# Patient Record
Sex: Male | Born: 1945 | Race: White | Hispanic: No | Marital: Married
Health system: Northeastern US, Academic
[De-identification: ages and names within clinical notes are randomized; demographics above are authoritative.]

---

## 2015-12-27 ENCOUNTER — Ambulatory Visit

## 2016-01-11 ENCOUNTER — Ambulatory Visit (HOSPITAL_BASED_OUTPATIENT_CLINIC_OR_DEPARTMENT_OTHER): Admitting: Psychiatry

## 2016-01-11 ENCOUNTER — Ambulatory Visit: Admitting: Neurological Surgery

## 2016-01-11 ENCOUNTER — Ambulatory Visit

## 2016-01-11 NOTE — Progress Notes (Signed)
.  Progress Notes  .  Patient: James Hampton, James Hampton  Provider: Corinna Capra I   .  DOB: 1945/07/29 Age: 70 Y Sex: Male  .  PCP: Dorthula Nettles MD  Date: 01/11/2016  .  --------------------------------------------------------------------------------  .  REASON FOR APPOINTMENT  .  1. Low back pain and right leg pain  .  HISTORY OF PRESENT ILLNESS  .  Associated Providers:  Primary Care Provider  Dr. Dorthula Nettles, [place] .  Other Providers  Daryel November MD, cardiology [phone].  .   70 y.o. male presents in followup status post L3-4 laminectomies  and left L3-4 TLIF on 12/27/14. He is also status post left L3-4  microdiskectomy and left L4-5 partial hemilaminectomy 08/05/13 and  L4-5 laminectomy and fusion in 2010. He had been doing well until  three months ago. He developed low back pain He also has right  leg pain. The right leg pain radiates from the right buttock to  the posterior thigh to the posterior calf. He denies left leg  pain. The right leg pain is worse with walking. He saw vascular  surgeon and was ruled out for vascular problem. He has not  undergone recent injections. He has had lumbar injections in  2010. He has had multiple courses of physical therapy without  lasting benefit.  .  CURRENT MEDICATIONS  .  Taking Aspirin 81 MG Tablet Delayed Release 1 tablet Once a day  Taking Crestor 40 MG Tablet 1 tablet at bedtime Once a day  Taking Gabapentin 300 MG Capsule 1 capsule at bedtime once a day  Taking Vexol 1 % Suspension 1 drop into left eye once a day  Taking Zetia 10 MG Tablet 1 tablet Once a day  Taking Aleve 220 MG Tablet 1 tablet as needed every 12 hrs  Medication List reviewed and reconciled with the patient  .  PAST MEDICAL HISTORY  .  Coronary artery disease status post stent placement times three  2007  Hypercholesterolemia  Iritis due to shingles left eye 2007  Right knee pain (cortisone injections)  Bleeding hemorrhoids  Lyme disease  .  ALLERGIES  .  Penicillin: unknown  reaction  .  SURGICAL HISTORY  .  L4-L5 laminectomy and fusion 05/04/2009  coronary artery stent times three 2007  Left L3-L4 microdiscectomy with left L4-L5 partial  hemilaminectomy 08/05/2013  Right knee replacement 10/10/13  L3-4 revision laminectomies and left L3-4 TLIF (transforaminal  lumbar interbody fusion) 12/27/2014  .  FAMILY HISTORY  .  Mother: deceased, stomach cancer, diagnosed with Cancer  Father: deceased, renal failure, diagnosed with Other  1 son(s) , 1 daughter(s) - healthy.  1 brother died from lung cancer age 64. 1 sister has COPD.  Marland Kitchen  SOCIAL HISTORY  .  .  Tobacco  history: Formerly smoked.  .  Work/Occupation: retired.  .  Alcohol Occasionally.  Marland Kitchen  HOSPITALIZATION/MAJOR DIAGNOSTIC PROCEDURE  .  see surgeries  .  VITAL SIGNS  .  Pain scale 6-8, Ht-in 5'9", Wt-lbs 162, BMI 23.92.  Marland Kitchen  PHYSICAL EXAMINATION  .  NEUROSURGERY:  MOTOR Lower Extremities  : 5/5 strength in the bilateral lower  extremities. :5/5 strength in the bilateral lower extremities.  SENSATION  : Abnormal, : decreased to light touch decreased  sensation to light touch left shin. :Abnormal , :decreased to  light touch decreased sensation to light touch left shin.  Reflexes  Left Knee Jerk Absent, Right Knee Jerk 2+, Left Ankle  Jerk 1+, Right  Ankle Jerk 1+. Left Knee JerkAbsent , Right Knee  Jerk2+ , Left Ankle Jerk1+ , Right Ankle Jerk1+. Gait  : WNL.  :WNL. Babinski  : Negative. :Negative. Ankle Clonus  : Negative.  :Negative.  DIAGNOSTIC STUDIES:  MRI  Uploaded to PACS. I reviewed MRI lumbar spine performed on  12/24/15 at [facility] .  Marland Kitchen  ASSESSMENTS  .  S/P lumbar fusion - Z98.1 (Primary)  .  MRI shows foraminal stenosis L5-S1, now symptomatic.Recommend  L5-S1 ESI.If this fails to help, he will call me to schedule  L5-S1 TLIF with extension of hardware to L3. Likely would be done  in an open fashion.  .  TREATMENT  .  S/P lumbar fusion  Notes: as above.  .  Electronically signed by Corinna Capra , MD on  01/11/2016 at  09:30 AM EDT  .  Document electronically signed by Dionisio David, RON I   .

## 2016-01-11 NOTE — Progress Notes (Signed)
* * *        **Perfect, [name]**    --- ---    70 Y old Male, DOB: 09-Jul-1946, External MRN: [id number]    Account Number: [id number]    [address] [place], [id number]    Home: [phone]    Insurance: MEDICARE Payer ID: PAPER    PCP: Dorthula Nettles, MD Referring: Dorthula Nettles, MD External Visit ID:  [id number]    Appointment Facility: Neurosurgery        * * *    01/11/2016  Progress Notes: Corinna Capra, MD **CHN#:** [id number]    --- ---    ---        Current Medications    ---    Taking     * Aspirin 81 MG Tablet Delayed Release 1 tablet Once a day    ---    * Crestor 40 MG Tablet 1 tablet at bedtime Once a day    ---    * Gabapentin 300 MG Capsule 1 capsule at bedtime once a day    ---    * Vexol 1 % Suspension 1 drop into left eye once a day    ---    * Zetia 10 MG Tablet 1 tablet Once a day    ---    * Aleve 220 MG Tablet 1 tablet as needed every 12 hrs    ---    * Medication List reviewed and reconciled with the patient    ---      Past Medical History    ---       Coronary artery disease status post stent placement times three 2007.        ---    Hypercholesterolemia.        ---    Iritis due to shingles left eye 2007.        ---    Right knee pain (cortisone injections).        ---    Bleeding hemorrhoids.        ---    Lyme disease.        ---      Surgical History    ---      L4-L5 laminectomy and fusion 05/04/2009    ---    coronary artery stent times three 2007    ---    Left L3-L4 microdiscectomy with left L4-L5 partial hemilaminectomy 08/05/2013    ---    Right knee replacement 10/10/13    ---    L3-4 revision laminectomies and left L3-4 TLIF (transforaminal lumbar  interbody fusion) 12/27/2014    ---      Family History    ---      Mother: deceased, stomach cancer, diagnosed with Cancer    ---    Father: deceased, renal failure, diagnosed with Other    ---    1 son(s) , 1 daughter(s) - healthy.    ---    1 brother died from lung cancer age 20. 1 sister has COPD.    ---      Social  History    ---    Tobacco history: Formerly smoked.    Work/Occupation: retired.    Alcohol  Occasionally.      Allergies    ---      Penicillin: unknown reaction    ---    [Allergies Verified]      Hospitalization/Major Diagnostic Procedure    ---  see surgeries    ---        Reason for Appointment    ---      1\. Low back pain and right leg pain    ---      History of Present Illness    ---     _Associated Providers_ :    Primary Care Provider Dr. Dorthula Nettles, [place]. Other Providers  Daryel November MD, cardiology [phone].    70 y.o. male presents in followup status post L3-4 laminectomies and left L3-4  TLIF on 12/27/14. He is also status post left L3-4 microdiskectomy and left  L4-5 partial hemilaminectomy 08/05/13 and L4-5 laminectomy and fusion in 2010.  He had been doing well until three months ago. He developed low back pain He  also has right leg pain. The right leg pain radiates from the right buttock to  the posterior thigh to the posterior calf. He denies left leg pain. The right  leg pain is worse with walking. He saw vascular surgeon and was ruled out for  vascular problem. He has not undergone recent injections. He has had lumbar  injections in 2010. He has had multiple courses of physical therapy without  lasting benefit.      Vital Signs    ---    Pain scale 6-8, Ht-in 5'9", Wt-lbs 162, BMI 23.92.      Physical Examination    ---     _NEUROSURGERY_ :    MOTOR Lower Extremities : 5/5 strength in the bilateral lower extremities.  SENSATION : Abnormal, : decreased to light touch decreased sensation to light  touch left shin. Reflexes Left Knee Jerk Absent, Right Knee Jerk 2+, Left  Ankle Jerk 1+, Right Ankle Jerk 1+. Gait : WNL. Babinski : Negative. Ankle  Clonus : Negative.    _DIAGNOSTIC STUDIES_ :    MRI Uploaded to PACS. I reviewed MRI lumbar spine performed on 12/24/15 at  [facility] .          Assessments    ---    1\. S/P lumbar fusion - Z98.1 (Primary)    ---      MRI shows  foraminal stenosis L5-S1, now symptomatic.    Recommend L5-S1 ESI.    If this fails to help, he will call me to schedule L5-S1 TLIF with extension  of hardware to L3. Likely would be done in an open fashion.    ---      Treatment    ---       **1\. S/P lumbar fusion**    Notes: as above.    ---    Electronically signed by Corinna Capra , MD on 01/11/2016 at 09:30 AM EDT    Sign off status: Completed        * * *        Neurosurgery    [address]    [place], [place] [address]    Tel: [phone]    Fax: [phone]              * * *          Patient: James Hampton, James Hampton DOB: 31-May-1946 Progress Note: Corinna Capra,  MD 01/11/2016    ---    Note generated by eClinicalWorks EMR/PM Software ([email or web address])

## 2020-06-27 IMAGING — MR MULTI PARAMETRIC MRI PROSTATE WITHOUT AND WITH CONTRAST
13 series · 48 of 48 positions shown · IV contrast (gadavist)
Comparison: None

MULTI PARAMETRIC MRI PROSTATE WITHOUT AND WITH CONTRAST, 06/27/2020 [DATE]: 
CLINICAL INDICATION: Elevated PSA measuring 4.9 ng/mL
TECHNIQUE: Multiple parametric sequences were performed. 
Pre-contrast: T1 axial of the entire pelvis. T2 sagittal, axial and coronal, T1 
axial, acquired of the prostate. Diffusion with multiple B values of 4111, 2966 
calculated ADC value for mapping. 
Post contrast: Rapid sequence dynamic and axial planes through the prostate and 
seminal vesicles, T1 axial with fat sat of the entire pelvis. 3-D renderings 
were reconstructed on an independent workstation. The images were also evaluated 
with Dyna CAD computer aided detection.  7.5 mL of Gadavist was administered 
intravenously.  As per [HOSPITAL] guidelines 3-D 
reconstructions are performed with concurrent physician supervision. The 
patient's eGFR was calculated to be 54.1 using the i-STAT device.

[Series 101: survey-include kidney(hilum) · axial · 10.0mm · 1.34mm/px · 1 of 14 slices shown]
[im 1/14]
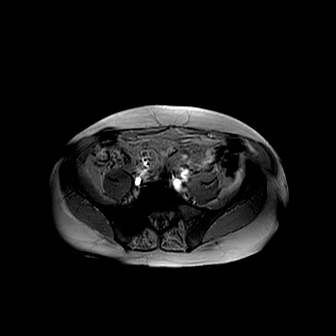

[Series 201: t1w_tse_ax · axial · 6.0mm · 0.45mm/px · 1 of 36 slices shown]
[im 1/36]
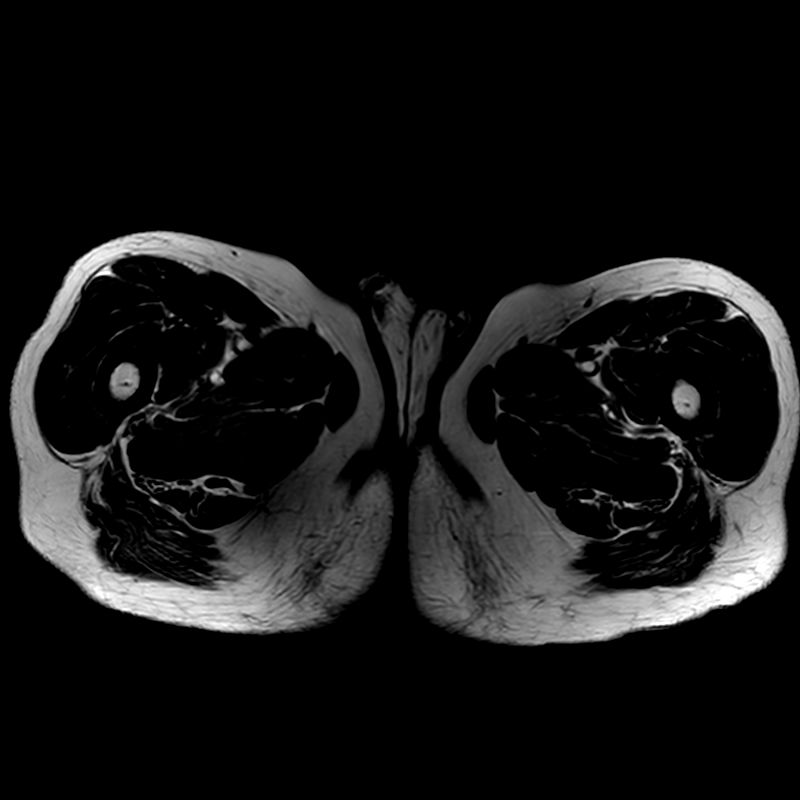

[Series 301: t2w sag · sagittal · 3.0mm · 0.45mm/px · 1 of 30 slices shown]
[im 1/30]
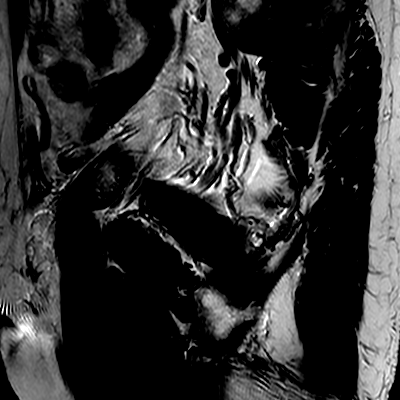

[Series 401: t2w cor · coronal · 3.0mm · 0.42mm/px · 1 of 30 slices shown]
[im 1/30]
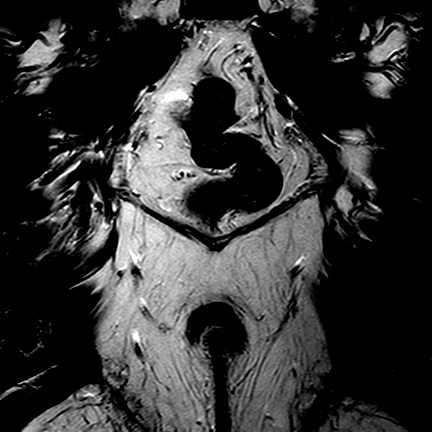

[Series 501: t2w ax · axial · 3.0mm · 0.27mm/px · 1 of 32 slices shown]
[im 1/32]
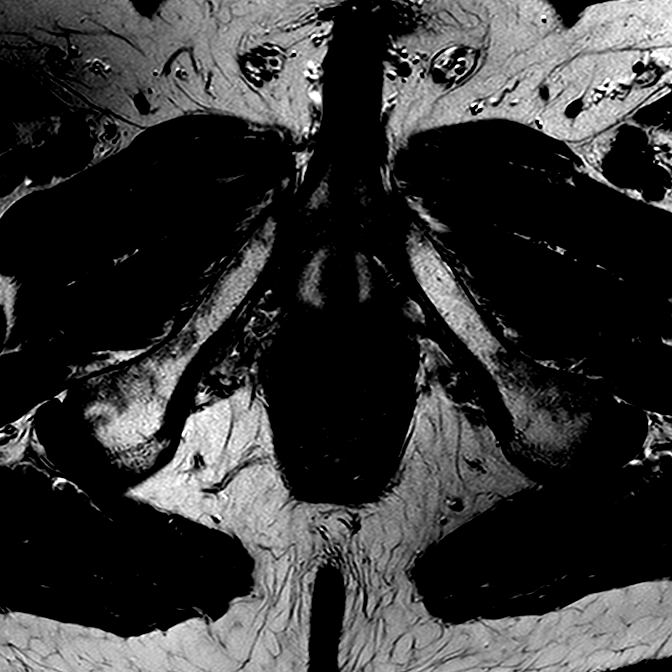

[Series 601: new-dwi_3b* 3mm* · axial · 3.0mm · 1.28mm/px · 1 of 64 slices shown]
[im 1/64]
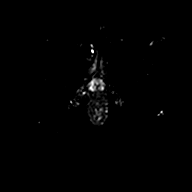

[Series 602: ADC · axial · 3.0mm · 1.28mm/px · 1 of 32 slices shown (1 of 2)]
[im 1/32]
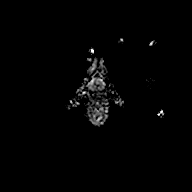

[Series 603: ADC · axial · 3.0mm · 1.28mm/px · 1 of 32 slices shown (2 of 2)]
[im 1/32]
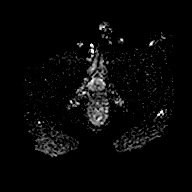

[Series 604: (id) · axial · 3.0mm · 1.28mm/px · 1 of 32 slices shown (1 of 3)]
[im 1/32]
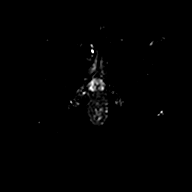

[Series 605: (id) · axial · 3.0mm · 1.28mm/px · 1 of 32 slices shown (2 of 3)]
[im 1/32]
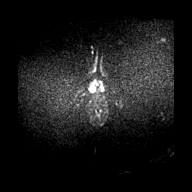

[Series 701: (id) · axial · 3.0mm · 1.28mm/px · 1 of 32 slices shown (3 of 3)]
[im 1/32]
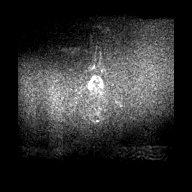

[Series 901: dyn 3mm*(ap) · axial · 3.0mm · 1.38mm/px · z∈[-175,-82]mm · 36 of 1440 slices shown]
[im 1/1440]
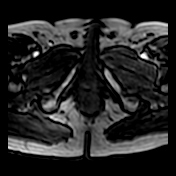
[im 42/1440]
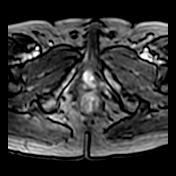
[im 83/1440]
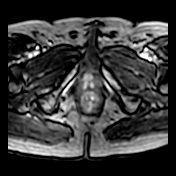
[im 124/1440]
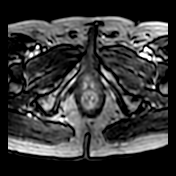
[im 165/1440]
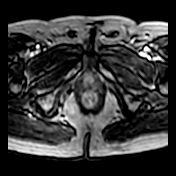
[im 206/1440]
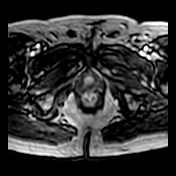
[im 247/1440]
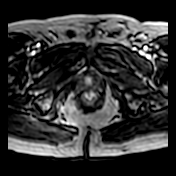
[im 288/1440]
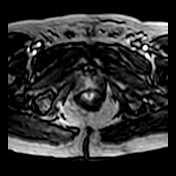
[im 329/1440]
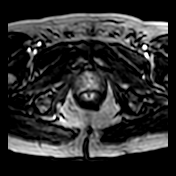
[im 371/1440]
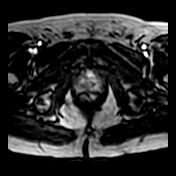
[im 412/1440]
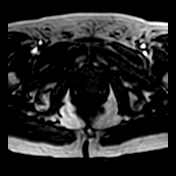
[im 453/1440]
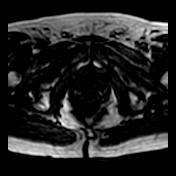
[im 494/1440]
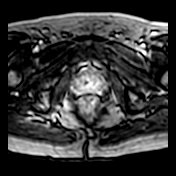
[im 535/1440]
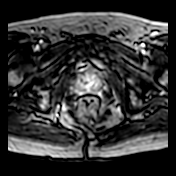
[im 576/1440]
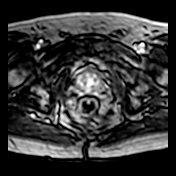
[im 617/1440]
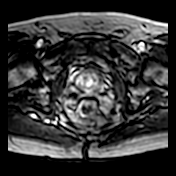
[im 658/1440]
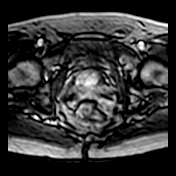
[im 699/1440]
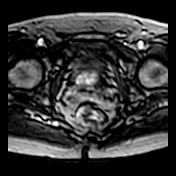
[im 741/1440]
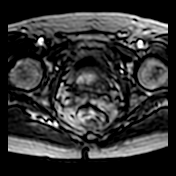
[im 782/1440]
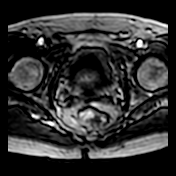
[im 823/1440]
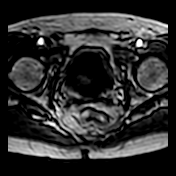
[im 864/1440]
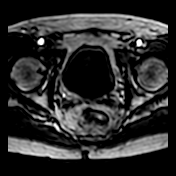
[im 905/1440]
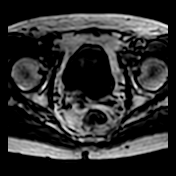
[im 946/1440]
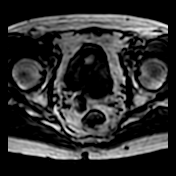
[im 987/1440]
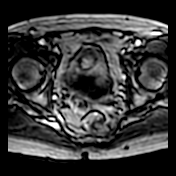
[im 1028/1440]
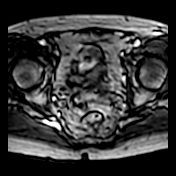
[im 1069/1440]
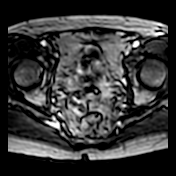
[im 1111/1440]
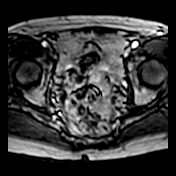
[im 1152/1440]
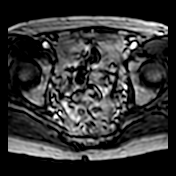
[im 1193/1440]
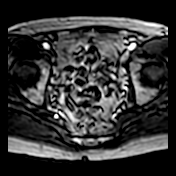
[im 1234/1440]
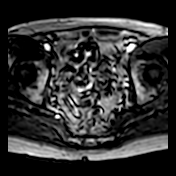
[im 1275/1440]
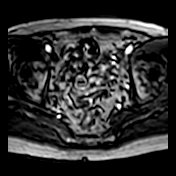
[im 1316/1440]
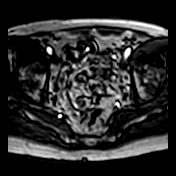
[im 1357/1440]
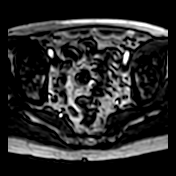
[im 1398/1440]
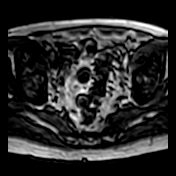
[im 1440/1440]
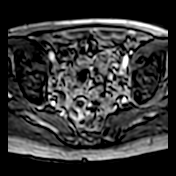

[Series 1001: t1w/sp/pel+g · axial · 6.5mm · 0.61mm/px · 1 of 40 slices shown]
[im 1/40]
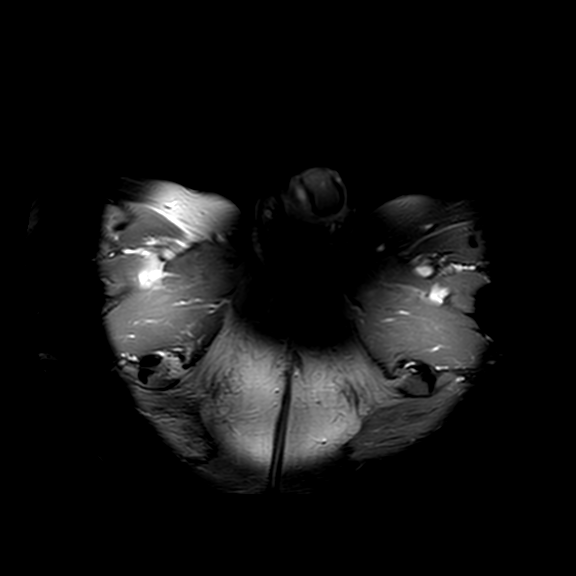

[48 of 48 positions shown; findings below may reference images not displayed]

FINDINGS: VOLUME: 28.3 mL 
CENTRAL GLAND: Nodularity in heterogeneous T2 signal of the central gland 
related to BPH. No suspicious lesions. 
PERIPHERAL ZONE: Linear areas of decreased T2 signal involving the peripheral 
zone most likely due to scarring. No areas of restricted diffusion or nodularity 
identified. 
PROSTATE CAPSULE: Intact 
MUSCLE SIDE WALLS: Normal 
SEMINAL VESICLES: Normal 
BLADDER: Mildly trabeculated. 
LYMPHADENOPATHY: None 
BONES: Surgical changes of the lumbar spine. No suspicious bony lesions. 
OTHER: Diverticular disease of the sigmoid colon.
IMPRESSION: 1. Nodularity of the central gland suggesting BPH. No suspicious lesions. 
2. Moderate scarring of the peripheral zone without mass. 3 diverticular disease 
of the sigmoid colon. 
(PI-RADS 2): Low (clinically significant cancer is unlikely to be present).

## 2020-10-09 NOTE — Progress Notes (Signed)
* * *        **James Hampton, James Hampton**    --- ---    66 Y old Male, DOB: 09-Jul-1946, External MRN: [id number]    Account Number: [id number]    [address] [place], [id number]    Home: [phone]    Insurance: MEDICARE Payer ID: PAPER    PCP: Dorthula Nettles, MD Referring: Dorthula Nettles, MD External Visit ID:  [id number]    Appointment Facility: Neurosurgery        * * *    01/11/2016  Progress Notes: Corinna Capra, MD **CHN#:** [id number]    --- ---    ---        Current Medications    ---    Taking     * Aspirin 81 MG Tablet Delayed Release 1 tablet Once a day    ---    * Crestor 40 MG Tablet 1 tablet at bedtime Once a day    ---    * Gabapentin 300 MG Capsule 1 capsule at bedtime once a day    ---    * Vexol 1 % Suspension 1 drop into left eye once a day    ---    * Zetia 10 MG Tablet 1 tablet Once a day    ---    * Aleve 220 MG Tablet 1 tablet as needed every 12 hrs    ---    * Medication List reviewed and reconciled with the patient    ---      Past Medical History    ---       Coronary artery disease status post stent placement times three 2007.        ---    Hypercholesterolemia.        ---    Iritis due to shingles left eye 2007.        ---    Right knee pain (cortisone injections).        ---    Bleeding hemorrhoids.        ---    Lyme disease.        ---      Surgical History    ---      L4-L5 laminectomy and fusion 05/04/2009    ---    coronary artery stent times three 2007    ---    Left L3-L4 microdiscectomy with left L4-L5 partial hemilaminectomy 08/05/2013    ---    Right knee replacement 10/10/13    ---    L3-4 revision laminectomies and left L3-4 TLIF (transforaminal lumbar  interbody fusion) 12/27/2014    ---      Family History    ---      Mother: deceased, stomach cancer, diagnosed with Cancer    ---    Father: deceased, renal failure, diagnosed with Other    ---    1 son(s) , 1 daughter(s) - healthy.    ---    1 brother died from lung cancer age 20. 1 sister has COPD.    ---      Social  History    ---    Tobacco history: Formerly smoked.    Work/Occupation: retired.    Alcohol  Occasionally.      Allergies    ---      Penicillin: unknown reaction    ---    [Allergies Verified]      Hospitalization/Major Diagnostic Procedure    ---  see surgeries    ---        Reason for Appointment    ---      1\. Low back pain and right leg pain    ---      History of Present Illness    ---     _Associated Providers_ :    Primary Care Provider Dr. Dorthula Nettles, [facility] [place]. Other Providers  Daryel November MD, cardiology [phone].    75 y.o. male presents in followup status post L3-4 laminectomies and left L3-4  TLIF on 12/27/14. He is also status post left L3-4 microdiskectomy and left  L4-5 partial hemilaminectomy 08/05/13 and L4-5 laminectomy and fusion in 2010.  He had been doing well until three months ago. He developed low back pain He  also has right leg pain. The right leg pain radiates from the right buttock to  the posterior thigh to the posterior calf. He denies left leg pain. The right  leg pain is worse with walking. He saw vascular surgeon and was ruled out for  vascular problem. He has not undergone recent injections. He has had lumbar  injections in 2010. He has had multiple courses of physical therapy without  lasting benefit.      Vital Signs    ---    Pain scale 6-8, Ht-in 5'9", Wt-lbs 162, BMI 23.92.      Physical Examination    ---     _NEUROSURGERY_ :    MOTOR Lower Extremities : 5/5 strength in the bilateral lower extremities.  SENSATION : Abnormal, : decreased to light touch decreased sensation to light  touch left shin. Reflexes Left Knee Jerk Absent, Right Knee Jerk 2+, Left  Ankle Jerk 1+, Right Ankle Jerk 1+. Gait : WNL. Babinski : Negative. Ankle  Clonus : Negative.    _DIAGNOSTIC STUDIES_ :    MRI Uploaded to PACS. I reviewed MRI lumbar spine performed on 12/24/15 at  [facility] .          Assessments    ---    1\. S/P lumbar fusion - Z98.1 (Primary)    ---      MRI shows  foraminal stenosis L5-S1, now symptomatic.    Recommend L5-S1 ESI.    If this fails to help, he will call me to schedule L5-S1 TLIF with extension  of hardware to L3. Likely would be done in an open fashion.    ---      Treatment    ---       **1\. S/P lumbar fusion**    Notes: as above.    ---    Electronically signed by Corinna Capra , MD on 01/11/2016 at 09:30 AM EDT    Sign off status: Completed        * * *        Neurosurgery    [address]    [place], [place] [address]    Tel: [phone]    Fax: [phone]              * * *          Patient: James Hampton, James Hampton DOB: 31-May-1946 Progress Note: Corinna Capra,  MD 01/11/2016    ---    Note generated by eClinicalWorks EMR/PM Software ([email or web address])

## 2020-10-18 IMAGING — MR MRI LUMBAR SPINE W/WO CONTRAST
4 of 9 series · 19 of 48 positions shown · IV contrast (gadavist)
Comparison: MRI exam of 12/24/2015 and dating back to 11/13/2008. CT exam of 
12/22/2014.

MRI LUMBAR SPINE W/WO CONTRAST, 10/18/2020 [DATE]: 
CLINICAL INDICATION: History of 3 prior lumbar surgeries with the last surgery 
in 0096. Ablation 7 weeks ago. Pain across lower back. No pain down legs since 
the ablation. Stenosis of lumbar region without myelopathy or radiculopathy.
TECHNIQUE: Multiplanar, multiecho position MR images of the lumbar spine were 
performed without and with 8 mL of Gadavist enhancement. The patient's eGFR was 
calculated to be 54 using the i-STAT device. Metallic suppression techniques 
were used.

[Series 301: T1 · sagittal · 4.0mm · 0.55mm/px · 4 of 19 slices shown (1 of 3)]
[im 1/19]
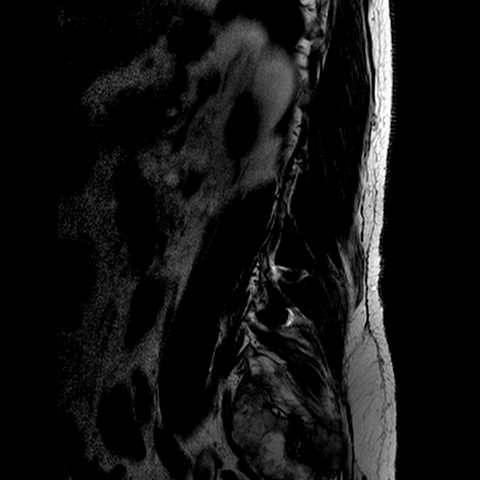
[im 7/19]
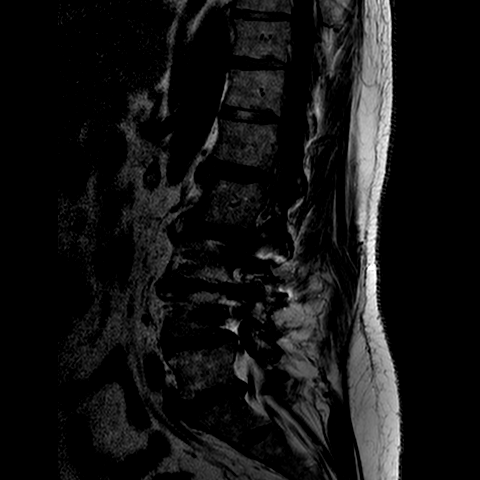
[im 13/19]
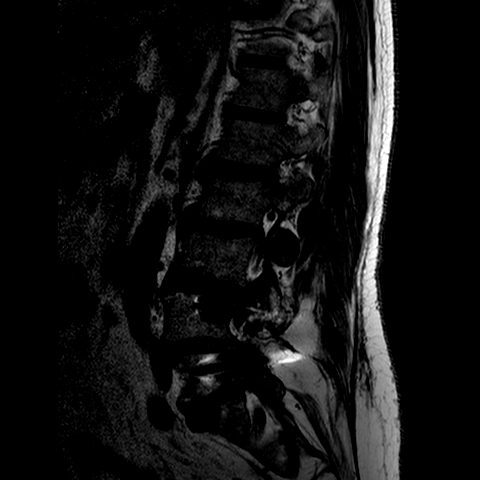
[im 19/19]
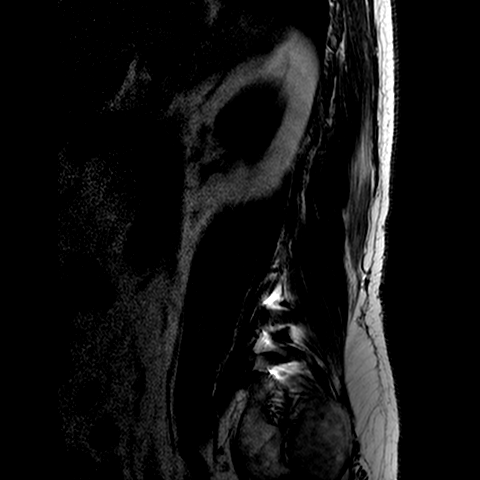

[Series 601: T2 · axial · 4.0mm · 0.47mm/px · z∈[-55,+176]mm · 6 of 30 slices shown]
[im 1/30]
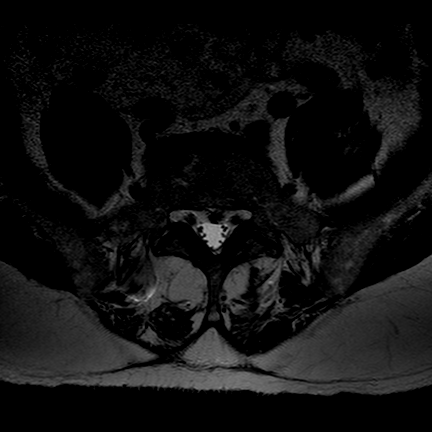
[im 6/30]
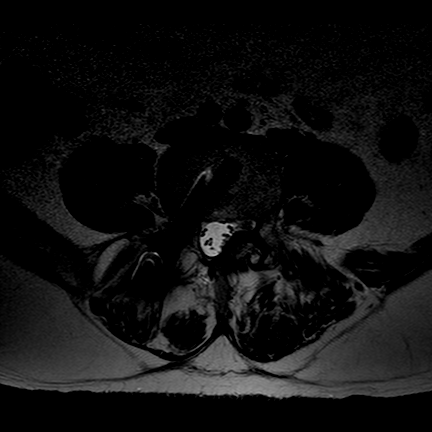
[im 12/30]
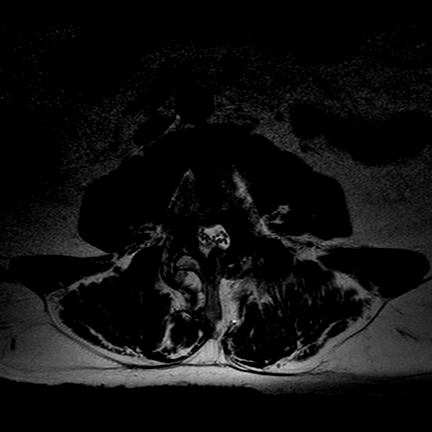
[im 18/30]
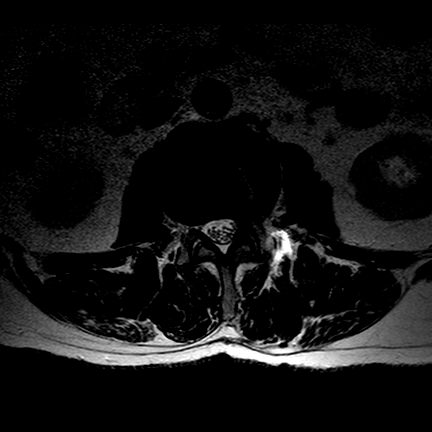
[im 24/30]
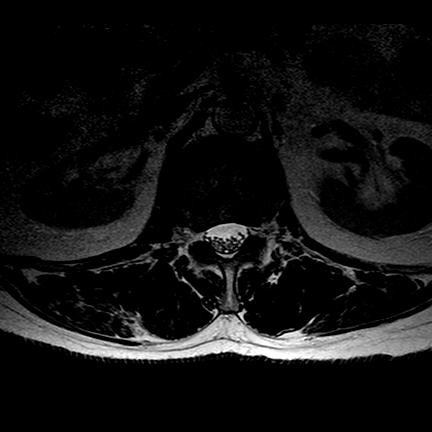
[im 30/30]
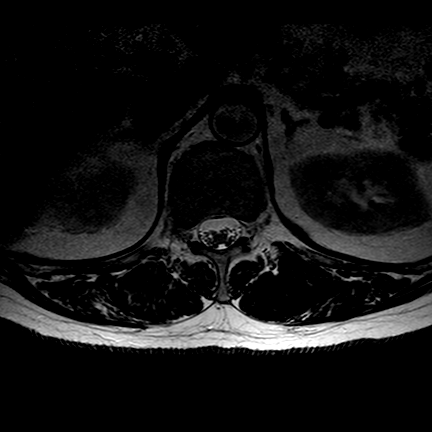

[Series 701: T1 · axial · 4.0mm · 0.35mm/px · z∈[-54,+151]mm · 6 of 42 slices shown (2 of 3)]
[im 1/42]
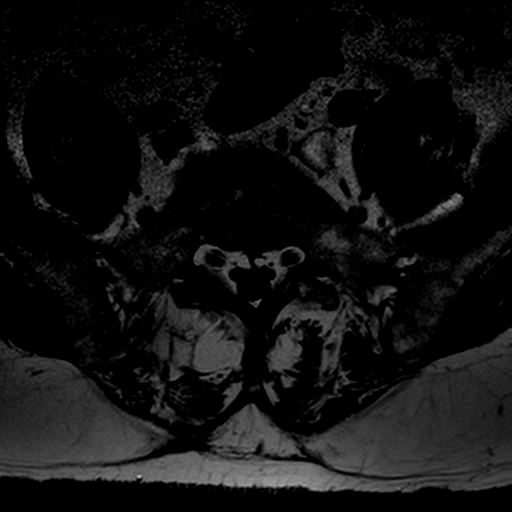
[im 6/42]
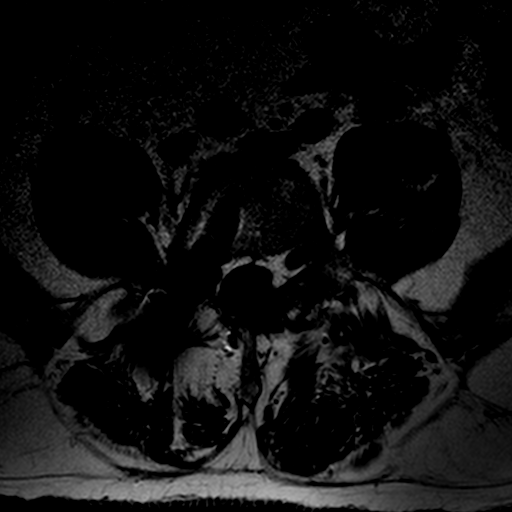
[im 11/42]
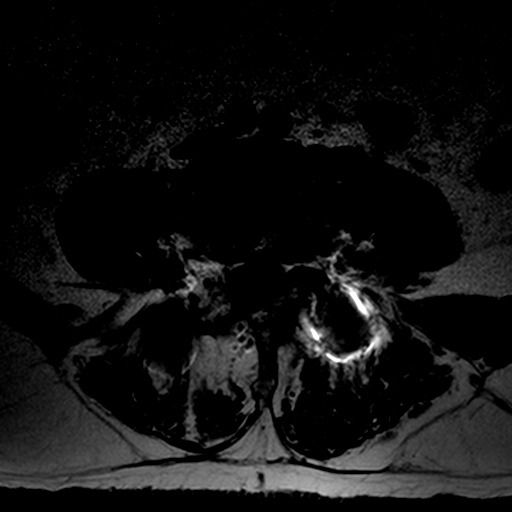
[im 16/42]
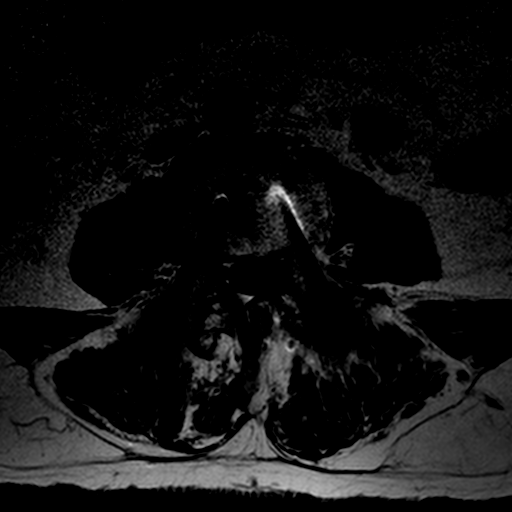
[im 21/42]
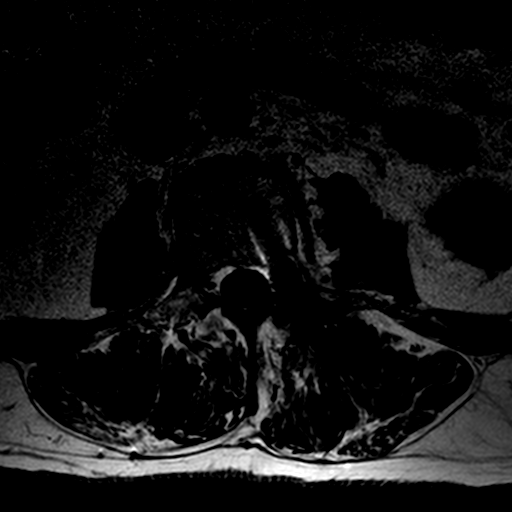
[im 36/42]
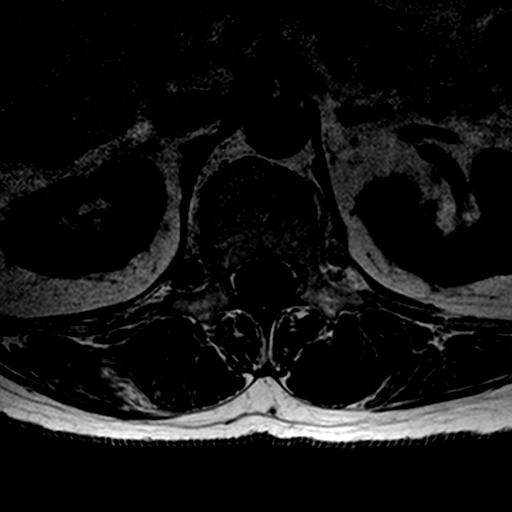

[Series 801: T1 · sagittal · 4.0mm · 0.55mm/px · 3 of 19 slices shown (3 of 3)]
[im 1/19]
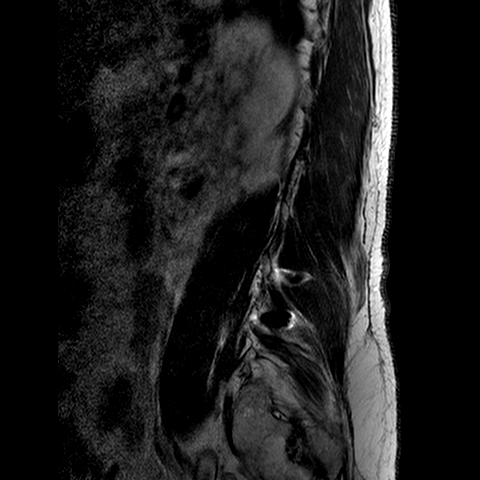
[im 13/19]
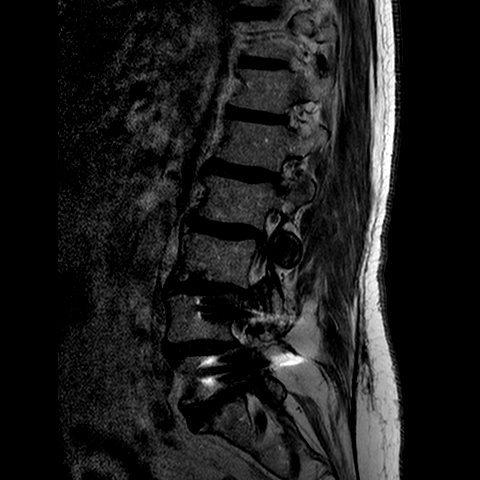
[im 19/19]
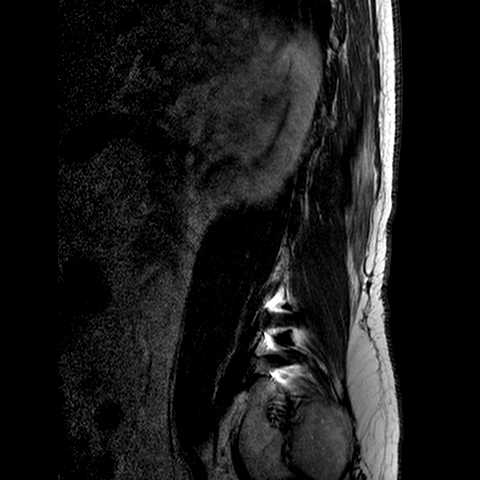

[19 of 48 positions shown; findings below may reference images not displayed]

FINDINGS: There are 5 lumbar-type vertebral bodies. Mild dextrocurvature. 
Right-sided transpedicular screw and rod fixation is again seen at L4-L5 and 
left-sided at L3-L4. There is interbody spacer at L4-L5. There is trace 
retrolisthesis L2 on L3 and L3 on L4, unchanged. No vertebral body fracture. 
There are a few small Schmorl's nodes. No vertebral body fracture. Paraspinal 
musculature is symmetric. Normal aortic diameter. The conus tip terminates at 
the L1 vertebral body level. No abnormal disc, marrow or cord enhancement. 
Modic I-II: L3-L4 and L4-L5. 
T12-L1: Mild disc desiccation without disc height loss. No disc herniation. 
Normal facets. No spinal canal or neural foraminal stenosis. 
L1-L2: Mild disc desiccation and dorsal disc height loss. Shallow annular bulge. 
Normal facets. No spinal canal or neural foraminal stenosis. 
L2-L3: Mild disc desiccation and dorsal disc height loss. Mild broad-based disc 
protrusion eccentric to the left posterior lateral aspect with some 
subligamentous disc extension on the left within the left lateral recess with 
abutment of the traversing left L3 nerve root. Mild left neural foraminal 
stenosis. 
L3-L4: Diffuse disc desiccation and disc height loss. Mild dorsal disc 
osteophyte complex. Partial left hemilaminectomy and partial facetectomy. The 
thecal sac is narrowed to 7 to 8 mm AP at the level of the mid sagittal plane. 
Moderate facet and ligamentum flavum hypertrophy. There is a synovial cyst 
suggested along the ventral medial aspect of the facet with left lateral recess 
stenosis with potential for mass effect upon the traversing left L4 nerve root. 
This is seen for example on the axial T1 nonfat saturated series, images 13 
through 16. This measures approximately 4 mm in cross-section involves an SI 
distance of approximately 1.6 cm extending caudally. This is suggested to be 
present on the prior exam. 
L4-L5: Postsurgical changes including posterior decompression. There is left 
lateral recess stenosis with abutment of the traversing left L4 nerve root as 
seen for example on the Dionela Kamargo nonfat sat T1 axial series, image 10. 
L5-S1: Moderate disc desiccation and dorsal disc height loss. Mild broad-based 
disc protrusion with abutment of the exiting bilateral L5 nerve roots. Moderate 
facet hypertrophy. Moderate bilateral neural foraminal stenoses. 
When compared to the prior MR exam there's been no discrete interval change
IMPRESSION: 1.  Postsurgical and moderately advanced spondylotic changes lumbar spine. 
2.  Mild degenerative retrolisthesis L2 on L3 and L3 on L4. 
3.  At L2-L3, there is a disc protrusion with subligamentous disc extension 
potential for left L3 neural impingement and mild left neural foraminal 
stenosis. 
4.  At L3-L4, there is partial left hemilaminectomy and facetectomy with 
suggestion of a synovial cyst along the ventral aspect of the remaining facet 
with potential for mass effect upon the traversing left L4 nerve root. 
5.  At L4-L5, there is left lateral recess stenosis with potential for neural 
impingement. 
6.  At L5-S1, there is a disc protrusion with potential for impingement of the 
bilateral L5 nerve roots and moderate bilateral neural foraminal stenoses.

## 2020-12-10 IMAGING — MR MRI THORACIC SPINE WITHOUT CONTRAST
4 of 5 series · 19 of 48 positions shown · IV contrast (gadolinium)
Comparison: None.

UNIT 1 
MRI THORACIC SPINE WITHOUT CONTRAST, 12/10/2020 [DATE]: 
CLINICAL INDICATION: Preop testing. Evaluation for spinal cord stimulator 
implantation. Pain around beltline.
TECHNIQUE: Multiplanar, multiecho position MR images of the thoracic spine were 
performed without intravenous gadolinium enhancement.

[Series 101: survey · axial · 10.0mm · 1.41mm/px · z∈[-15,+224]mm · 3 of 9 slices shown]
[im 1/9]
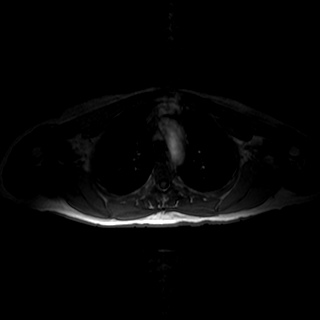
[im 5/9]
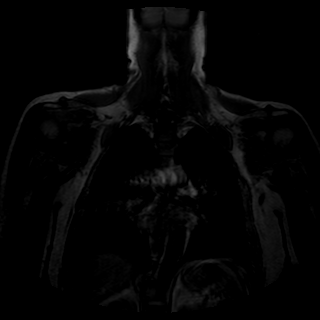
[im 9/9]
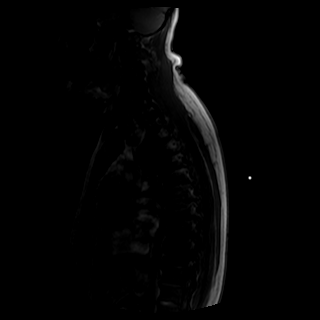

[Series 301: t1w tse sag · sagittal · 3.2mm · 0.64mm/px · 6 of 15 slices shown]
[im 1/15]
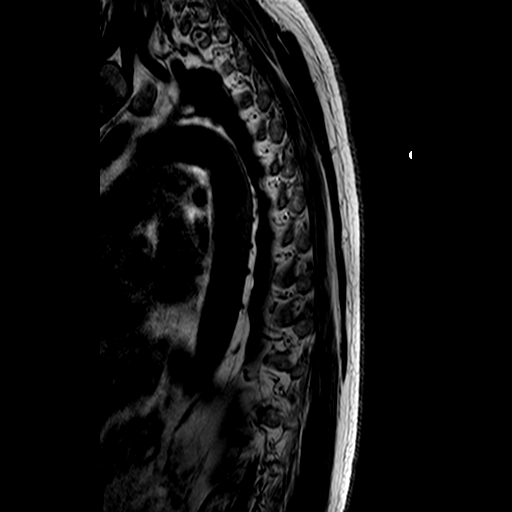
[im 3/15]
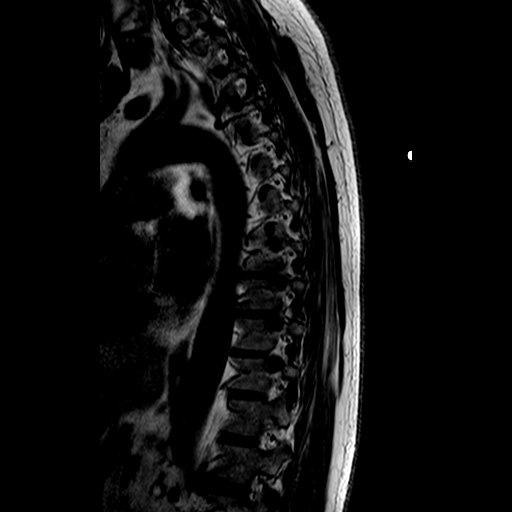
[im 6/15]
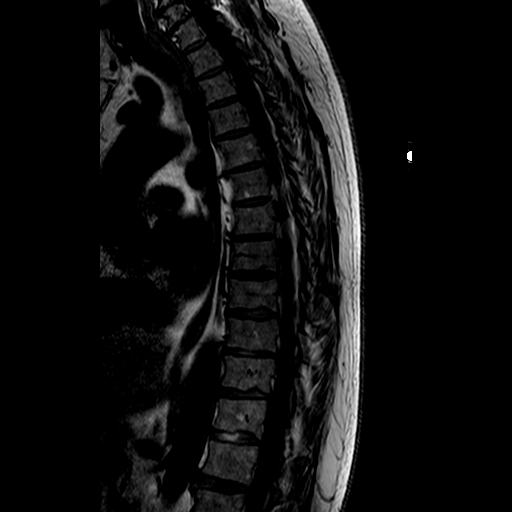
[im 9/15]
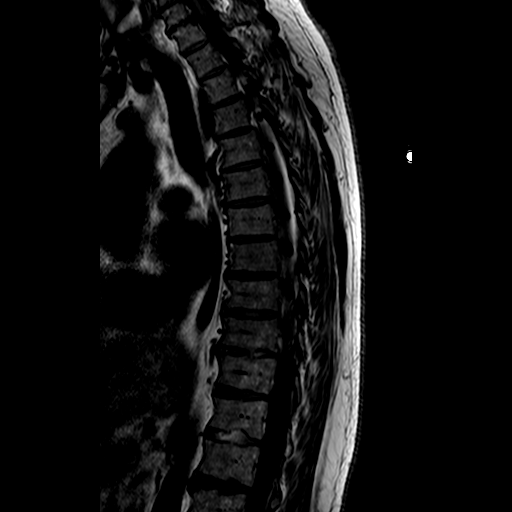
[im 12/15]
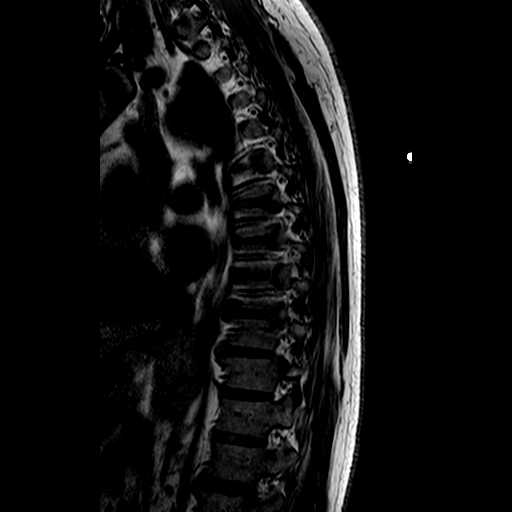
[im 15/15]
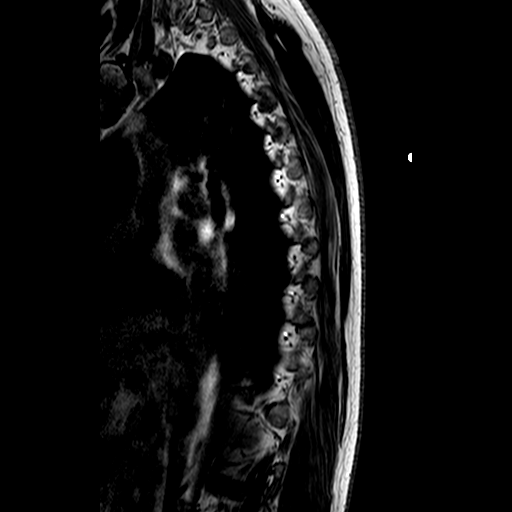

[Series 401: t2w tse sag · sagittal · 3.2mm · 0.68mm/px · 7 of 17 slices shown]
[im 1/17]
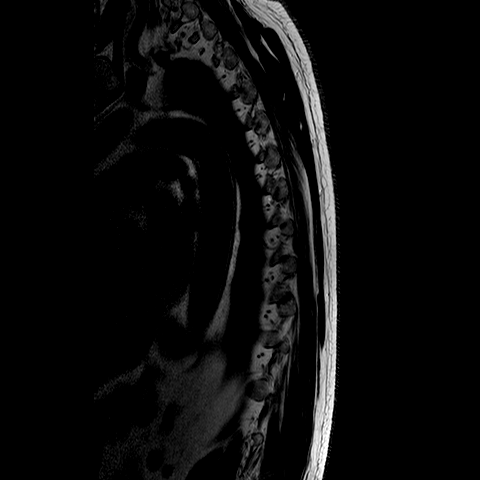
[im 3/17]
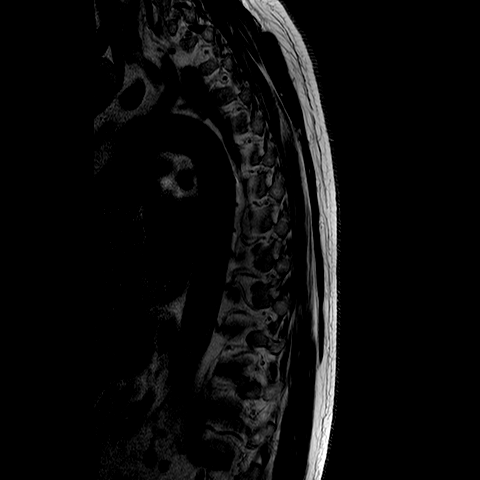
[im 6/17]
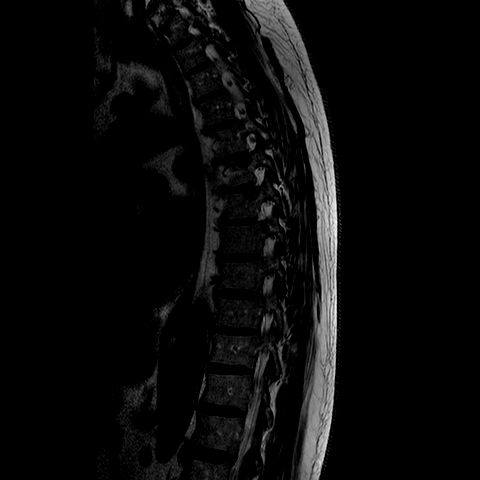
[im 9/17]
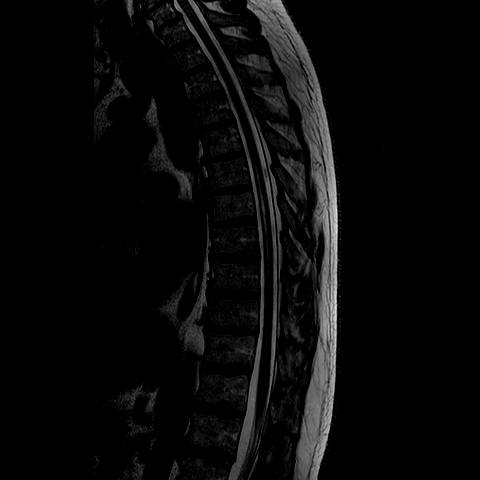
[im 11/17]
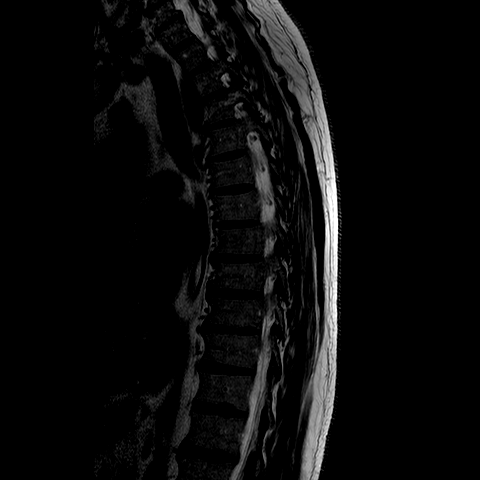
[im 14/17]
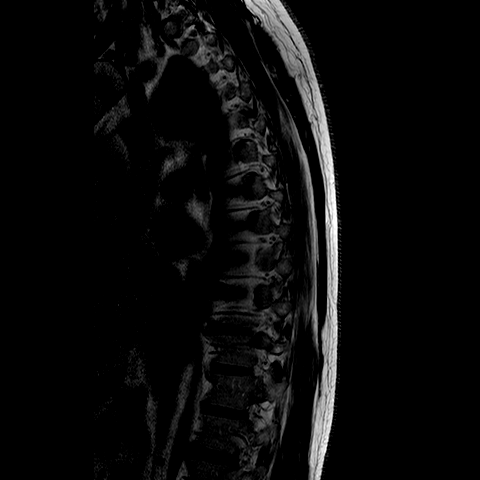
[im 17/17]
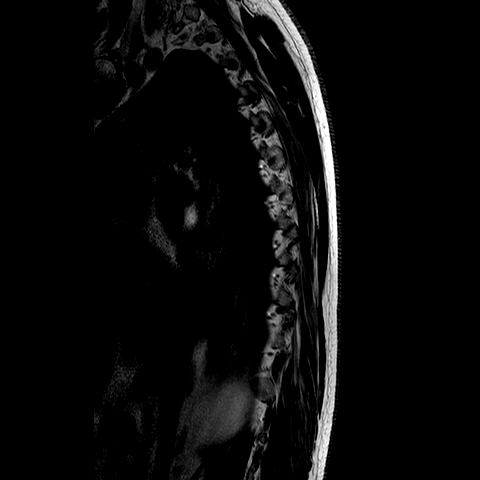

[Series 501: stir_longte · sagittal · 3.2mm · 0.49mm/px · 3 of 15 slices shown]
[im 3/15]
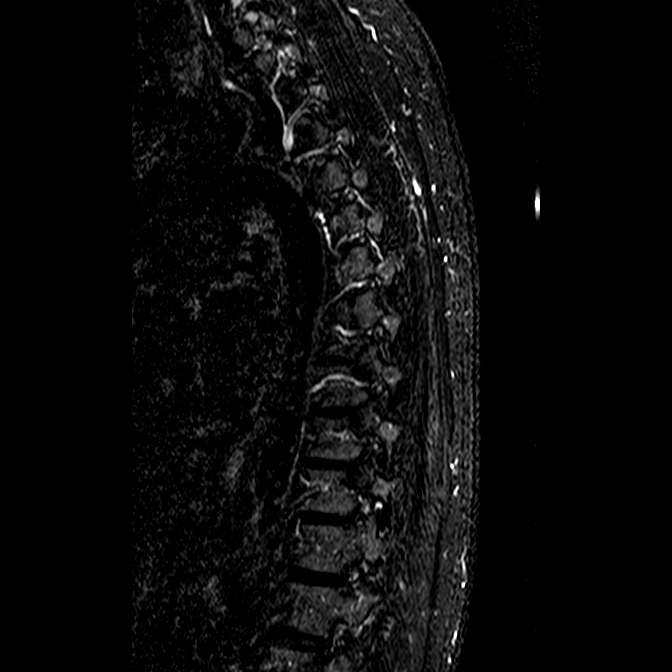
[im 9/15]
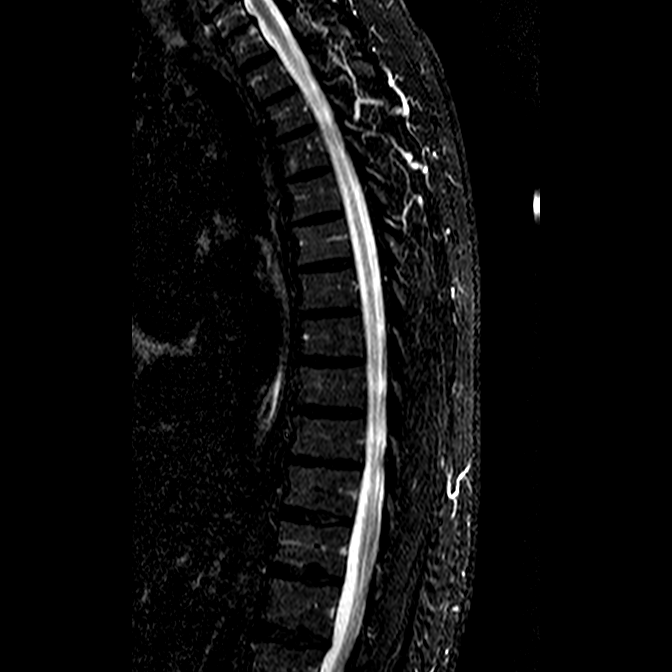
[im 15/15]
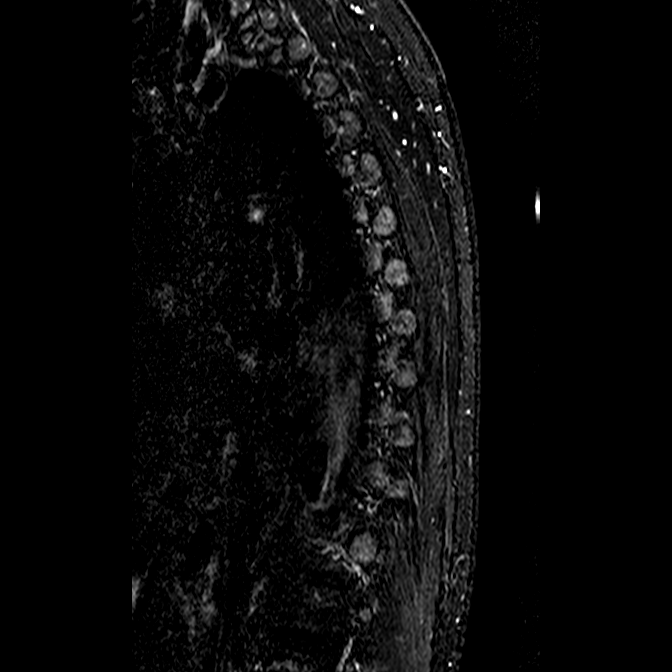

[19 of 48 positions shown; findings below may reference images not displayed]

FINDINGS: VERTEBRA: No fractures or marrow replacing lesions. No central canal, lateral 
recess or neural foraminal stenosis. 
DISCS: Multilevel degenerative disc disease, most marked at C7-T1. Mild disc 
bulges at C7-T1. No disc protrusions or evidence of discitis. 
SPINAL CORD: Small syrinx at the level of T6-T9, with the central canal 
measuring up to 2 mm in diameter. Spinal cord is otherwise normal in caliber and 
signal intensity. Conus medullaris is at the level of T12-L1. 
OTHER: Localizer views demonstrate multilevel degenerative change of the 
cervical spine and postsurgical/degenerative change of the lumbar spine. 1.1 cm 
T2 hyperintense ovoid focus in the right lobe of the liver, likely a cyst.
IMPRESSION: 1.  Multilevel degenerative change. 
2.  Small syrinx at the level of T6-T9. 
3.  Multilevel degenerative change of the cervical spine and 
postsurgical/degenerative change of the lumbar spine: Please refer to 10/18/2020 
MRI lumbar spine dictation. 
4.  Probable 1.1 cm right hepatic cyst.

## 2021-07-30 IMAGING — MR MRI RIGHT SHOULDER WITHOUT CONTRAST
5 of 7 series · 23 of 40 positions shown · IV contrast (gadolinium)
Comparison: None

________________________________________________________________________________________________ 
MRI RIGHT SHOULDER WITHOUT CONTRAST, 07/30/2021 [DATE]: 
CLINICAL INDICATION: Pain. Injury occurred during axe throwing 3 months ago. 
Pain increased past 3 weeks swatting a bug.
TECHNIQUE: Multiplanar, multiecho position MR images of the shoulder were 
performed without intravenous gadolinium enhancement. Patient was scanned on a 
1.5T magnet.

[Series 101: survey_fullfov_transversal · axial · right · 10.0mm · 1.84mm/px · 1 of 7 slices shown]
[im 1/7]
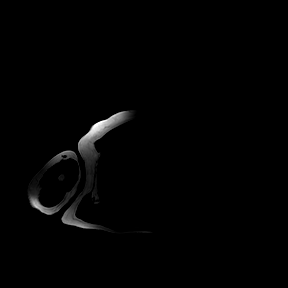

[Series 201: survey right · axial · right · 10.0mm · 0.71mm/px · z∈[-40,+125]mm · 4 of 15 slices shown]
[im 1/15]
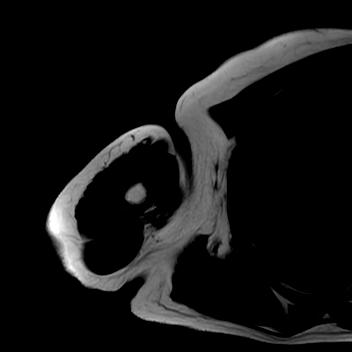
[im 5/15]
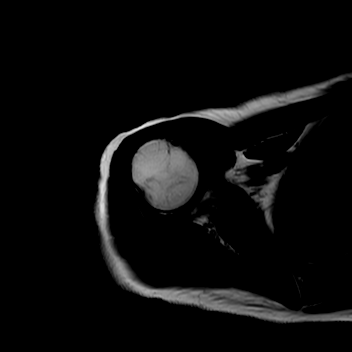
[im 10/15]
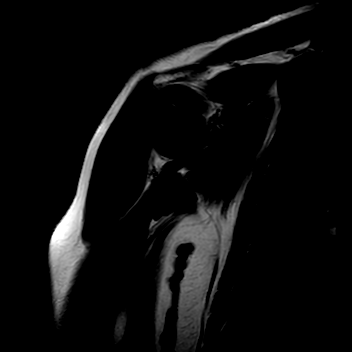
[im 15/15]
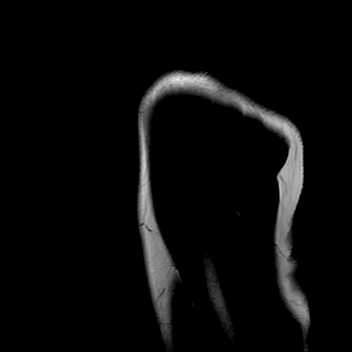

[Series 301: (person_name)_(person_name)_(person_name) right · axial · right · 3.0mm · 0.35mm/px · z∈[+13,+101]mm · 7 of 28 slices shown]
[im 1/28]
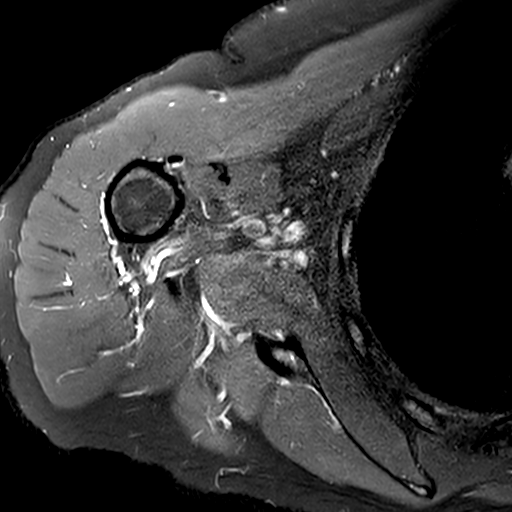
[im 5/28]
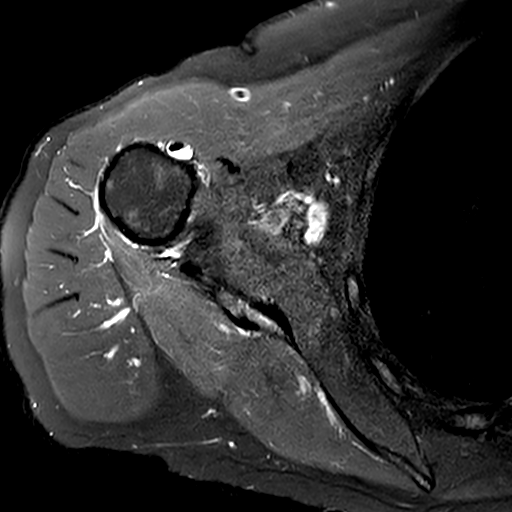
[im 10/28]
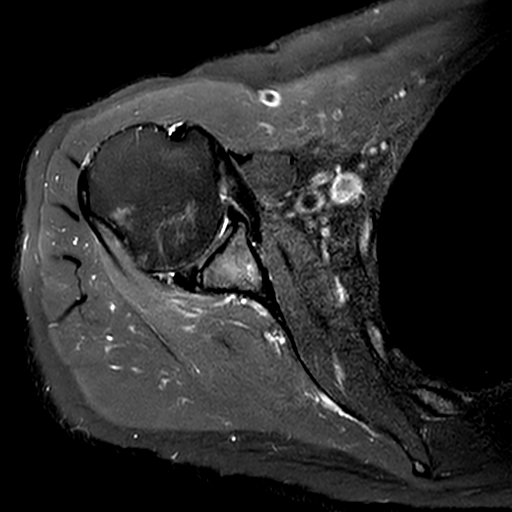
[im 14/28]
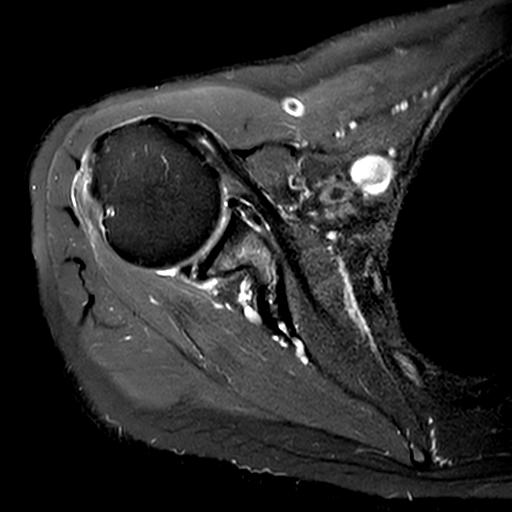
[im 19/28]
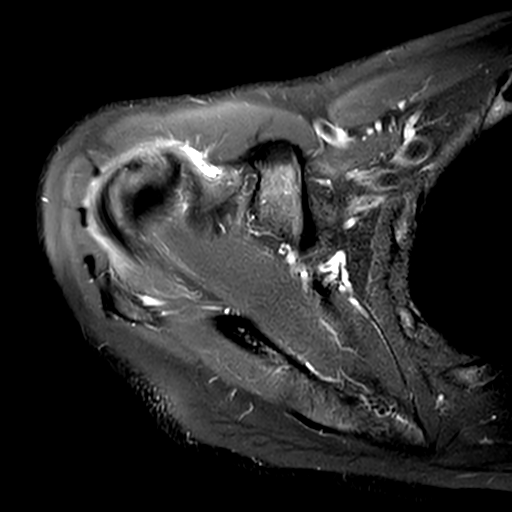
[im 23/28]
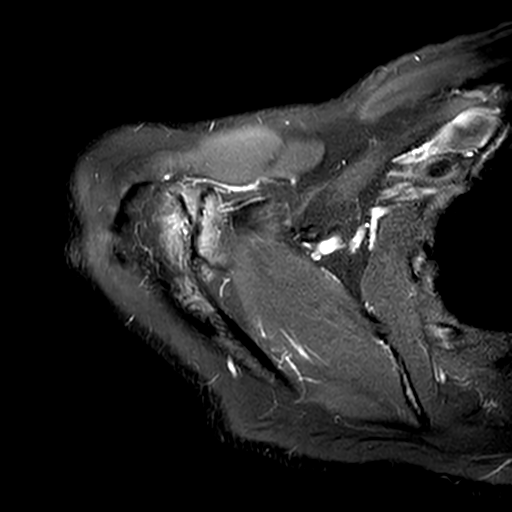
[im 28/28]
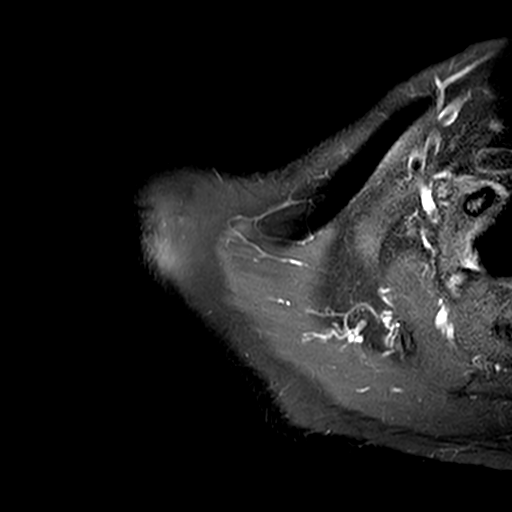

[Series 401: t2_fs_sag right · oblique · right · 3.0mm · 0.37mm/px · 8 of 30 slices shown]
[im 1/30]
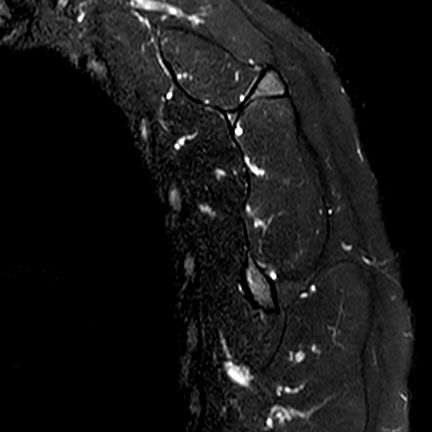
[im 5/30]
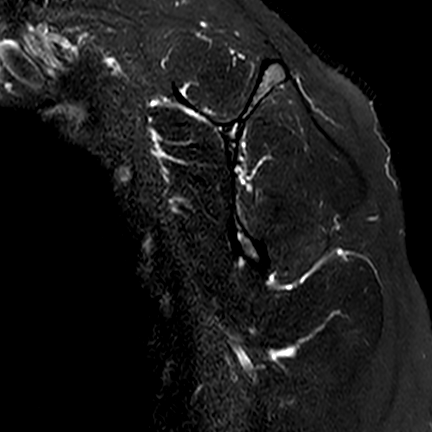
[im 9/30]
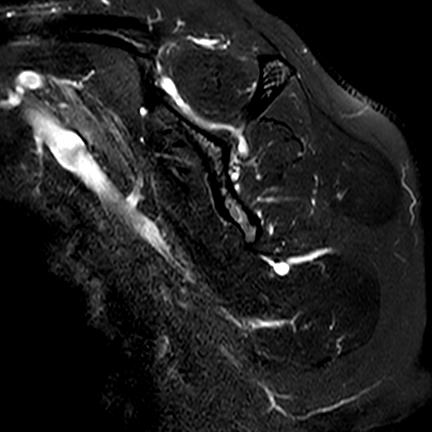
[im 13/30]
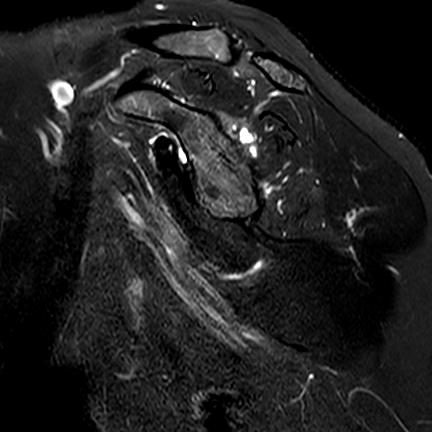
[im 17/30]
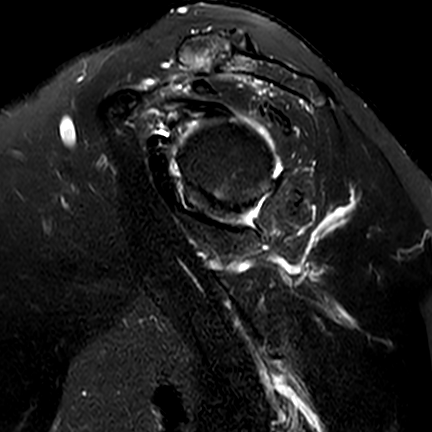
[im 21/30]
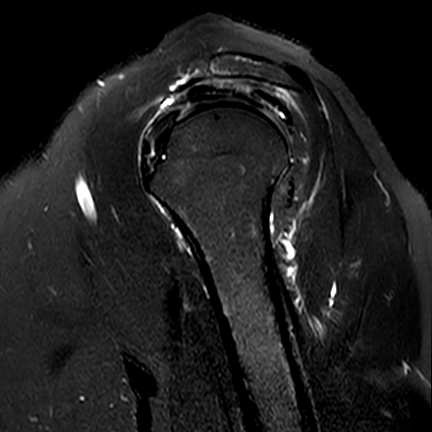
[im 25/30]
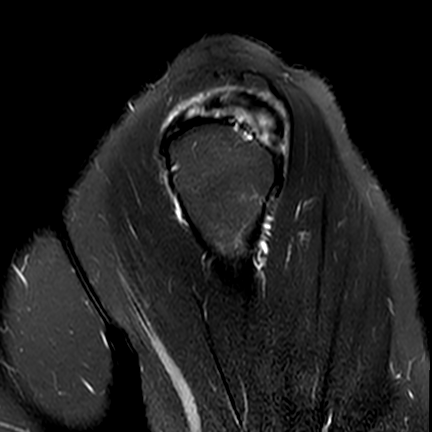
[im 30/30]
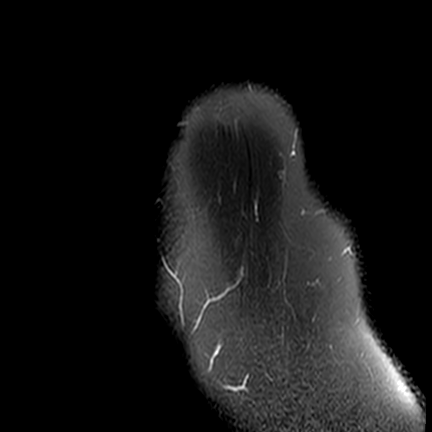

[Series 501: t1_sag right · oblique · right · 3.0mm · 0.31mm/px · 3 of 30 slices shown]
[im 1/30]
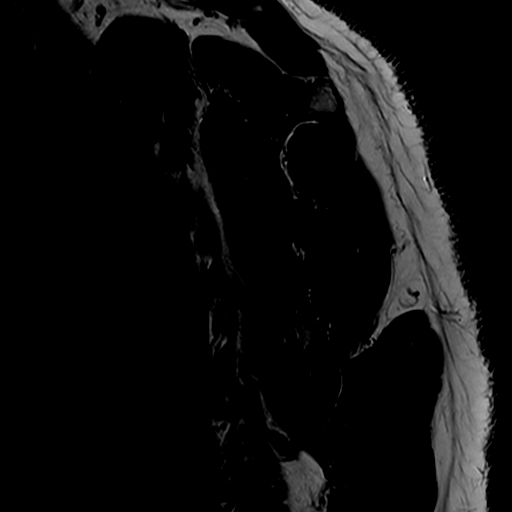
[im 5/30]
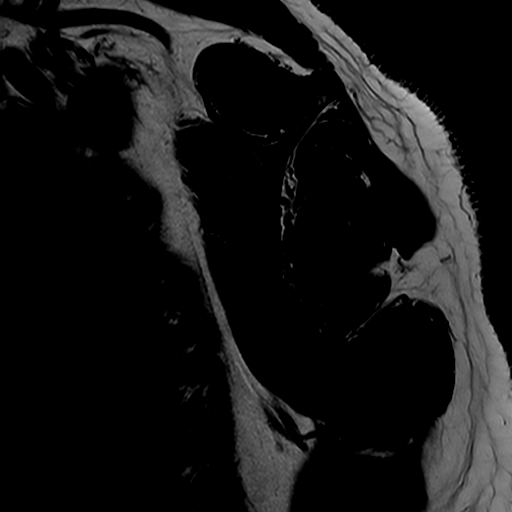
[im 9/30]
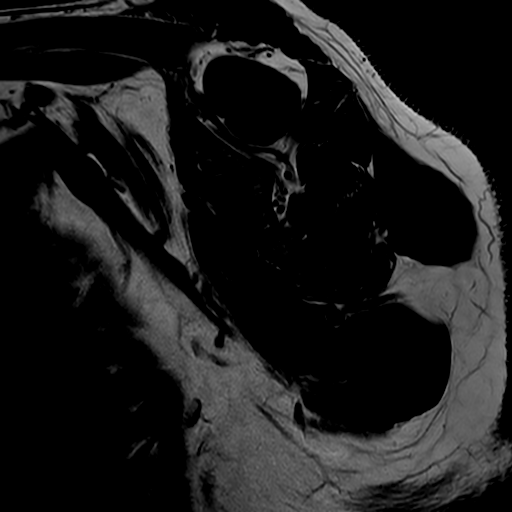

[23 of 40 positions shown; findings below may reference images not displayed]

FINDINGS: ROTATOR CUFF: Focal full-thickness tear of the posterior insertion of the 
supraspinatus measuring 9 mm AP with tendon retraction of approximately 3 mm. 
This is seen for example on series 601 image 13. Full-thickness tear extends to 
the aponeurotic juncture with the infraspinatus tendon. There is underlying 
tendinosis of the infraspinatus tendon with mild scattered low-grade partial 
articular surface tearing. There is low-grade partial tearing of the 
subscapularis insertion, series 4 one image 22. Rotator cuff musculature is 
symmetric without strain or asymmetric atrophy/fatty infiltration. 
ACROMIOCLAVICULAR JOINT: Mild hypertrophic changes of the acromioclavicular 
joint. There is partial effacement of the fat plane with the underlying 
supraspinatus. The coracoacromial ligament is intact without prominent spurring 
at the acromial attachment. The acromioclavicular and coracoclavicular ligaments 
are preserved. Fluid extends through the full-thickness tear into the 
subacromial-subdeltoid space. 
GLENOHUMERAL JOINT: Mild articular cartilaginous loss of the glenohumeral 
articulation. There is a superior labral tear with associated para labral cyst 
extending greatest posteriorly measuring up to 5 mm AP. Small shoulder joint 
effusion. The intra-articular portion of the long head of the biceps tendon is 
negative. 
BONES: The bone marrow signal intensity is negative for fracture. No Hill-Sachs 
defect. Subcortical cystic change of the humeral head. 
ADDITIONAL FINDINGS: The axillary region is negative. Subcutaneous tissues are 
negative.
IMPRESSION: 1.  Focal full-thickness tear of the supraspinatus tendon. 
2.  Infraspinatus tendinosis and low-grade articular surface tearing. 
3.  Mild degenerative changes of the glenohumeral articulation. 
4.  Mild AC joint arthropathy.
